# Patient Record
Sex: Male | Born: 1990 | Race: Black or African American | Hispanic: No | Marital: Single | Smoking: Current every day smoker
Health system: Southern US, Community
[De-identification: ages and names within clinical notes are randomized; demographics above are authoritative.]

---

## 2016-12-12 ENCOUNTER — Encounter (HOSPITAL_COMMUNITY): Payer: Self-pay | Admitting: Emergency Medicine

## 2016-12-12 ENCOUNTER — Emergency Department (HOSPITAL_COMMUNITY)
Admission: EM | Admit: 2016-12-12 | Discharge: 2016-12-12 | Disposition: A | Discharge status: Home / Self Care | Payer: Self-pay | Attending: Emergency Medicine | Admitting: Emergency Medicine

## 2016-12-12 DIAGNOSIS — H7292 Unspecified perforation of tympanic membrane, left ear: Secondary | ICD-10-CM | POA: Insufficient documentation

## 2016-12-12 DIAGNOSIS — F17290 Nicotine dependence, other tobacco product, uncomplicated: Secondary | ICD-10-CM | POA: Insufficient documentation

## 2016-12-12 MED ORDER — AMOXICILLIN-POT CLAVULANATE 875-125 MG PO TABS: 1.0000 | ORAL_TABLET | Freq: Two times a day (BID) | ORAL | 0 refills | Status: AC

## 2016-12-12 MED ORDER — IBUPROFEN 800 MG PO TABS: 800.0000 mg | ORAL_TABLET | Freq: Three times a day (TID) | ORAL | 0 refills | Status: AC | PRN

## 2016-12-12 MED ORDER — HYDROCODONE-ACETAMINOPHEN 5-325 MG PO TABS: 1.0000 | ORAL_TABLET | Freq: Four times a day (QID) | ORAL | 0 refills | Status: AC | PRN

## 2016-12-12 NOTE — ED Provider Notes (Signed)
  [facility]-EMERGENCY DEPT Provider Note   CSN: [id number] Arrival date & time: 12/12/16  2009     History   Chief Complaint Chief Complaint  Patient presents with  . Otalgia    HPI Devin Owens is a 26 y.o. male.  HPI Patient states about about 2 hours ago his daughter pushed a thermometer into his left ear canal.  He states he has decreased hearing and pain to that ear.  Patient states he did not take any medications prior to arrival.  Patient states nothing seems to make the condition better or worse.  Patient denies headache, blurred vision, weakness, numbness, dizziness or syncope History reviewed. No pertinent past medical history.  There are no active problems to display for this patient.   History reviewed. No pertinent surgical history.     Home Medications    Prior to Admission medications   Not on File    Family History No family history on file.  Social History Social History  Substance Use Topics  . Smoking status: Current Every Day Smoker    Types: Cigars  . Smokeless tobacco: Never Used  . Alcohol use Yes     Comment: occasionally     Allergies   Patient has no allergy information on record.   Review of Systems Review of Systems All other systems negative except as documented in the HPI. All pertinent positives and negatives as reviewed in the HPI.  Physical Exam Updated Vital Signs BP (!) 143/84 (BP Location: Right Arm)   Pulse 75   Temp 98.7 F (37.1 C) (Oral)   Resp 18   Ht 6' (1.829 m)   Wt 81.6 kg (180 lb)   SpO2 100%   BMI 24.41 kg/m   Physical Exam  Constitutional: He is oriented to person, place, and time. He appears well-developed and well-nourished. No distress.  HENT:  Head: Normocephalic and atraumatic.  Left Ear: Tympanic membrane is perforated.  Eyes: Pupils are equal, round, and reactive to light.  Pulmonary/Chest: Effort normal.  Neurological: He is alert and oriented to person, place, and time.  Skin: Skin is  warm and dry.  Psychiatric: He has a normal mood and affect.  Nursing note and vitals reviewed.    ED Treatments / Results  Labs (all labs ordered are listed, but only abnormal results are displayed) Labs Reviewed - No data to display  EKG  EKG Interpretation None       Radiology No results found.  Procedures Procedures (including critical care time)  Medications Ordered in ED Medications - No data to display   Initial Impression / Assessment and Plan / ED Course  I have reviewed the triage vital signs and the nursing notes.  Pertinent labs & imaging results that were available during my care of the patient were reviewed by me and considered in my medical decision making (see chart for details).     Patient will be referred to ENT.  Told to use cotton to catch any drainage from the ear.  Patient has a perforated TM.  Patient voices an understanding and all questions were answered  Final Clinical Impressions(s) / ED Diagnoses   Final diagnoses:  None    New Prescriptions New Prescriptions   No medications on file     Kyra Manges 12/12/16 2052    Nira Conn, MD 12/13/16 [phone]

## 2016-12-12 NOTE — Discharge Instructions (Signed)
Return here as needed.  Follow-up with the ENT provided.  He can use cotton to help with control of drainage and they help with the discomfort

## 2016-12-12 NOTE — ED Triage Notes (Signed)
Reports having a thermometer rammed into left ear by daughter earlier today.  Blood noted in ear canal.  Reports throbbing pain and decreased hearing.

## 2017-06-14 ENCOUNTER — Emergency Department (HOSPITAL_COMMUNITY): Payer: Worker's Compensation

## 2017-06-14 ENCOUNTER — Encounter (HOSPITAL_COMMUNITY): Payer: Self-pay | Admitting: Emergency Medicine

## 2017-06-14 ENCOUNTER — Emergency Department (HOSPITAL_COMMUNITY)
Admission: EM | Admit: 2017-06-14 | Discharge: 2017-06-14 | Disposition: A | Discharge status: Home / Self Care | Payer: Worker's Compensation | Attending: Emergency Medicine | Admitting: Emergency Medicine

## 2017-06-14 DIAGNOSIS — F17290 Nicotine dependence, other tobacco product, uncomplicated: Secondary | ICD-10-CM | POA: Insufficient documentation

## 2017-06-14 DIAGNOSIS — S0990XA Unspecified injury of head, initial encounter: Secondary | ICD-10-CM

## 2017-06-14 DIAGNOSIS — S0101XA Laceration without foreign body of scalp, initial encounter: Secondary | ICD-10-CM

## 2017-06-14 DIAGNOSIS — Y9389 Activity, other specified: Secondary | ICD-10-CM | POA: Insufficient documentation

## 2017-06-14 DIAGNOSIS — W228XXA Striking against or struck by other objects, initial encounter: Secondary | ICD-10-CM | POA: Insufficient documentation

## 2017-06-14 DIAGNOSIS — Z23 Encounter for immunization: Secondary | ICD-10-CM | POA: Insufficient documentation

## 2017-06-14 DIAGNOSIS — Y929 Unspecified place or not applicable: Secondary | ICD-10-CM | POA: Insufficient documentation

## 2017-06-14 DIAGNOSIS — Y990 Civilian activity done for income or pay: Secondary | ICD-10-CM | POA: Insufficient documentation

## 2017-06-14 DIAGNOSIS — Z79899 Other long term (current) drug therapy: Secondary | ICD-10-CM | POA: Insufficient documentation

## 2017-06-14 MED ORDER — ACETAMINOPHEN 325 MG PO TABS
650.0000 mg | ORAL_TABLET | Freq: Once | ORAL | Status: AC
  Administered 2017-06-14: 650 mg via ORAL
  Filled 2017-06-14: qty 2

## 2017-06-14 MED ORDER — NAPROXEN 500 MG PO TABS: 500.0000 mg | ORAL_TABLET | Freq: Two times a day (BID) | ORAL | 0 refills | Status: AC

## 2017-06-14 MED ORDER — NAPROXEN 250 MG PO TABS
500.0000 mg | ORAL_TABLET | Freq: Once | ORAL | Status: AC
  Administered 2017-06-14: 500 mg via ORAL
  Filled 2017-06-14: qty 2

## 2017-06-14 MED ORDER — TETANUS-DIPHTH-ACELL PERTUSSIS 5-2.5-18.5 LF-MCG/0.5 IM SUSP
0.5000 mL | Freq: Once | INTRAMUSCULAR | Status: AC
  Administered 2017-06-14: 0.5 mL via INTRAMUSCULAR
  Filled 2017-06-14: qty 0.5

## 2017-06-14 NOTE — ED Triage Notes (Signed)
Pt states he bumped his head at work at 1800 today. Denies LOC. 0.5 in laceration noted to head. A&O x 4.

## 2017-06-14 NOTE — Discharge Instructions (Signed)
Your were seen in the emergency department today following a head injury with a laceration to your scalp.  One staple was placed in the laceration to close it.  Your tetanus was updated.  Your head CT did not show any abnormalities.  We have given you a prescription for naproxen for pain. Naproxen is a nonsteroidal anti-inflammatory medication that will help with pain and swelling. Be sure to take this medication as prescribed with food, 1 pill every 12 hours,  It should be taken with food, as it can cause stomach upset, and more seriously, stomach bleeding. Do not take other nonsteroidal anti-inflammatory medications with this such as Advil, Motrin, or Aleve.   We have prescribed you new medication(s) today. Discuss the medications prescribed today with your pharmacist as they can have adverse effects and interactions with your other medicines including over the counter and prescribed medications. Seek medical evaluation if you start to experience new or abnormal symptoms after taking one of these medicines, seek care immediately if you start to experience difficulty breathing, feeling of your throat closing, facial swelling, or rash as these could be indications of a more serious allergic reaction    You will need to have the staple removed in 7 days.  You may have this removed in the emergency department, an urgent care, or by her primary care doctor.  Return to the emergency department sooner for any new or worsening symptoms including but not limited to fever, redness around the cut, discharge from the cut, change in your vision, numbness, weakness, vomiting, seizure activity, or passing out.  You may also return for any concerns that you may have.

## 2017-06-14 NOTE — ED Notes (Signed)
Patient transported to CT 

## 2017-06-14 NOTE — ED Provider Notes (Signed)
[facility] EMERGENCY DEPARTMENT Provider Note   CSN: [id number] Arrival date & time: 06/14/17  2045     History   Chief Complaint Chief Complaint  Patient presents with  . Laceration    HPI Devin Owens is a 27 y.o. male with a history of tobacco abuse who presents to the emergency department status post head injury at 1800 today with complaint of laceration and headache.  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  States that he did not have any loss of consciousness.  States that he felt mildly lightheaded following the incident, this has resolved.  States that he had a gradual onset steady progression headache most prominently in the area of the laceration.  States that headache has been constant, rates it an 8 out of 10 in severity.  States that he took Excedrin without significant relief.  Denies numbness, weakness, change in vision, nausea, vomiting, neck pain, syncope, or seizure activity.  Patient is unsure when his last tetanus shot was.  HPI  History reviewed. No pertinent past medical history.  There are no active problems to display for this patient.   History reviewed. No pertinent surgical history.      Home Medications    Prior to Admission medications   Medication Sig Start Date End Date Taking? Authorizing Provider  amoxicillin-clavulanate (AUGMENTIN) 875-125 MG tablet Take 1 tablet by mouth 2 (two) times daily. 12/12/16   Lawyer, Cristal Deer, PA-C  HYDROcodone-acetaminophen (NORCO/VICODIN) 5-325 MG tablet Take 1 tablet by mouth every 6 (six) hours as needed for moderate pain. 12/12/16   Lawyer, Cristal Deer, PA-C  ibuprofen (ADVIL,MOTRIN) 800 MG tablet Take 1 tablet (800 mg total) by mouth every 8 (eight) hours as needed. 12/12/16   Charlestine Night, PA-C    Family History History reviewed. No pertinent family history.  Social History Social History    Tobacco Use  . Smoking status: Current Every Day Smoker    Types: Cigars  . Smokeless tobacco: Never Used  Substance Use Topics  . Alcohol use: Yes    Comment: occasionally  . Drug use: No     Allergies   Patient has no known allergies.   Review of Systems Review of Systems  Constitutional: Negative for chills and fever.  Eyes: Negative for visual disturbance.  Gastrointestinal: Negative for nausea and vomiting.  Musculoskeletal: Negative for neck pain.  Skin: Positive for wound.  Neurological: Positive for light-headedness (resolved at present) and headaches. Negative for dizziness, seizures, syncope, speech difficulty, weakness and numbness.    Physical Exam Updated Vital Signs BP 132/79 (BP Location: Right Arm)   Pulse 72   Temp 98.1 F (36.7 C) (Oral)   Resp 16   SpO2 100%   Physical Exam  Constitutional: He appears well-developed and well-nourished.  Non-toxic appearance. No distress.  HENT:  Head: Normocephalic and atraumatic. Head is without raccoon's eyes and without Battle's sign.  Right Ear: No hemotympanum.  Left Ear: No hemotympanum.  Nose: Nose normal.  Mouth/Throat: Uvula is midline and oropharynx is clear and moist.  Eyes: Pupils are equal, round, and reactive to light. Conjunctivae and EOM are normal. Right eye exhibits no discharge. Left eye exhibits no discharge.  Neck: Normal range of motion. Neck supple. No spinous process tenderness and no muscular tenderness present.  Cardiovascular: Normal rate and regular rhythm.  Pulmonary/Chest: Effort normal and breath sounds normal.  Neurological:  He is alert.  Clear speech.  CN III through XII grossly intact.  Sensation grossly intact bilateral upper and lower extremities.  5 out of 5 grip strength bilaterally.  5 out of 5 strength with plantar and dorsiflexion.  Gait is steady.  Skin: Skin is warm and dry.     Psychiatric: He has a normal mood and affect. His behavior is normal. Thought content  normal.  Nursing note and vitals reviewed.    ED Treatments / Results  Labs (all labs ordered are listed, but only abnormal results are displayed) Labs Reviewed - No data to display  EKG None  Radiology Ct Head Wo Contrast  Result Date: 06/14/2017 CLINICAL DATA:  Laceration, headache EXAM: CT HEAD WITHOUT CONTRAST TECHNIQUE: Contiguous axial images were obtained from the base of the skull through the vertex without intravenous contrast. COMPARISON:  None. FINDINGS: Brain: No evidence of acute infarction, hemorrhage, hydrocephalus, extra-axial collection or mass lesion/mass effect. Vascular: No hyperdense vessel or unexpected calcification. Skull: Normal. Negative for fracture or focal lesion. Sinuses/Orbits: No acute finding. Other: None IMPRESSION: Negative non contrasted CT appearance of the brain. Electronically Signed   By: Jasmine PangKim  Fujinaga M.D.   On: 06/14/2017 22:40    Procedures .Marland Kitchen.Laceration Repair Date/Time: 06/14/2017 11:11 PM Performed by: Cherly AndersonPetrucelli, Smaran Gaus R, PA-C Authorized by: Cherly AndersonPetrucelli, Quadir Muns R, PA-C   Consent:    Consent obtained:  Verbal   Consent given by:  Patient   Risks discussed:  Infection, pain, retained foreign body and poor cosmetic result   Alternatives discussed:  No treatment Anesthesia (see MAR for exact dosages):    Anesthesia method:  None Laceration details:    Location:  Scalp   Scalp location:  Frontal   Length (cm):  1 Repair type:    Repair type:  Simple Pre-procedure details:    Preparation:  Patient was prepped and draped in usual sterile fashion Exploration:    Hemostasis achieved with:  Cautery   Wound exploration: wound explored through full range of motion and entire depth of wound probed and visualized     Contaminated: no   Treatment:    Area cleansed with:  Betadine   Amount of cleaning:  Standard   Irrigation solution:  Sterile saline   Irrigation method:  Pressure wash Skin repair:    Repair method:  Staples   Number  of staples:  1 Approximation:    Approximation:  Close Post-procedure details:    Dressing:  Antibiotic ointment   Patient tolerance of procedure:  Tolerated well, no immediate complications   (including critical care time)  Medications Ordered in ED Medications  naproxen (NAPROSYN) tablet 500 mg (has no administration in time range)  acetaminophen (TYLENOL) tablet 650 mg (has no administration in time range)  Tdap (BOOSTRIX) injection 0.5 mL (has no administration in time range)     Initial Impression / Assessment and Plan / ED Course  I have reviewed the triage vital signs and the nursing notes.  Pertinent labs & imaging results that were available during my care of the patient were reviewed by me and considered in my medical decision making (see chart for details).   Patient presents s/p head injury with laceration and headache. He is nontoxic appearing, in no apparent distress. Head CT ordered and negative for acute abnormality- doubt bleed. Patient without focal neurologic deficits on exam. Cspine is nontender. Laceration repair per procedure note above, 1 staple placed, tetanus updated. Will treat pain with Tylenol and Naproxen in the ED. Will  DC home with Naproxen, patient will require follow up in 1 week for staple removal and wound recheck. I discussed results, treatment plan, need for staple removal follow-up, and return precautions with the patient. Provided opportunity for questions, patient confirmed understanding and is in agreement with plan.  .   Final Clinical Impressions(s) / ED Diagnoses   Final diagnoses:  Injury of head, initial encounter  Laceration of scalp, initial encounter    ED Discharge Orders        Ordered    naproxen (NAPROSYN) 500 MG tablet  2 times daily     06/14/17 2317       Cherly Anderson, PA-C 06/14/17 2336    Terrilee Files, MD 06/15/17 1217

## 2020-02-15 ENCOUNTER — Emergency Department (HOSPITAL_COMMUNITY): Payer: HRSA Program

## 2020-02-15 ENCOUNTER — Emergency Department (HOSPITAL_COMMUNITY)
Admission: EM | Admit: 2020-02-15 | Discharge: 2020-02-15 | Disposition: A | Discharge status: Home / Self Care | Payer: HRSA Program | Attending: Emergency Medicine | Admitting: Emergency Medicine

## 2020-02-15 ENCOUNTER — Encounter (HOSPITAL_COMMUNITY): Payer: Self-pay | Admitting: Emergency Medicine

## 2020-02-15 ENCOUNTER — Other Ambulatory Visit: Payer: Self-pay

## 2020-02-15 DIAGNOSIS — U071 COVID-19: Secondary | ICD-10-CM | POA: Diagnosis not present

## 2020-02-15 DIAGNOSIS — F17290 Nicotine dependence, other tobacco product, uncomplicated: Secondary | ICD-10-CM | POA: Diagnosis not present

## 2020-02-15 DIAGNOSIS — R079 Chest pain, unspecified: Secondary | ICD-10-CM | POA: Diagnosis present

## 2020-02-15 LAB — CBC WITH DIFFERENTIAL/PLATELET
Abs Immature Granulocytes: 0.01 10*3/uL (ref 0.00–0.07)
Basophils Absolute: 0 10*3/uL (ref 0.0–0.1)
Basophils Relative: 0 %
Eosinophils Absolute: 0.2 10*3/uL (ref 0.0–0.5)
Eosinophils Relative: 5 %
HCT: 45.4 % (ref 39.0–52.0)
Hemoglobin: 14.9 g/dL (ref 13.0–17.0)
Immature Granulocytes: 0 %
Lymphocytes Relative: 39 %
Lymphs Abs: 1.2 10*3/uL (ref 0.7–4.0)
MCH: 30.4 pg (ref 26.0–34.0)
MCHC: 32.8 g/dL (ref 30.0–36.0)
MCV: 92.7 fL (ref 80.0–100.0)
Monocytes Absolute: 0.5 10*3/uL (ref 0.1–1.0)
Monocytes Relative: 17 %
Neutro Abs: 1.2 10*3/uL — ABNORMAL LOW (ref 1.7–7.7)
Neutrophils Relative %: 39 %
Platelets: 186 10*3/uL (ref 150–400)
RBC: 4.9 MIL/uL (ref 4.22–5.81)
RDW: 12.5 % (ref 11.5–15.5)
WBC: 3 10*3/uL — ABNORMAL LOW (ref 4.0–10.5)
nRBC: 0 % (ref 0.0–0.2)

## 2020-02-15 LAB — TROPONIN I (HIGH SENSITIVITY)
Troponin I (High Sensitivity): 3 ng/L (ref <18)
Troponin I (High Sensitivity): 3 ng/L (ref <18)

## 2020-02-15 LAB — COMPREHENSIVE METABOLIC PANEL
ALT: 25 U/L (ref 0–44)
AST: 36 U/L (ref 15–41)
Albumin: 3.9 g/dL (ref 3.5–5.0)
Alkaline Phosphatase: 51 U/L (ref 38–126)
Anion gap: 11 (ref 5–15)
BUN: 12 mg/dL (ref 6–20)
CO2: 24 mmol/L (ref 22–32)
Calcium: 8.8 mg/dL — ABNORMAL LOW (ref 8.9–10.3)
Chloride: 100 mmol/L (ref 98–111)
Creatinine, Ser: 1.35 mg/dL — ABNORMAL HIGH (ref 0.61–1.24)
GFR, Estimated: >60 mL/min (ref >60)
Glucose, Bld: 98 mg/dL (ref 70–99)
Potassium: 4 mmol/L (ref 3.5–5.1)
Sodium: 135 mmol/L (ref 135–145)
Total Bilirubin: 0.5 mg/dL (ref 0.3–1.2)
Total Protein: 6.8 g/dL (ref 6.5–8.1)

## 2020-02-15 LAB — RESP PANEL BY RT-PCR (FLU A&B, COVID) ARPGX2
Influenza A by PCR: NEGATIVE
Influenza B by PCR: NEGATIVE
SARS Coronavirus 2 by RT PCR: POSITIVE — AB

## 2020-02-15 MED ORDER — ACETAMINOPHEN 325 MG PO TABS
650.0000 mg | ORAL_TABLET | Freq: Once | ORAL | Status: AC
  Administered 2020-02-15: 650 mg via ORAL
  Filled 2020-02-15: qty 2

## 2020-02-15 MED ORDER — BENZONATATE 100 MG PO CAPS: 100.0000 mg | ORAL_CAPSULE | Freq: Three times a day (TID) | ORAL | 0 refills | Status: AC

## 2020-02-15 NOTE — Discharge Instructions (Signed)
Use cough medication as directed.  You should be isolated for at least 7 days since the onset of your symptoms AND >72 hours after symptoms resolution (absence of fever without the use of fever reducing medication and improvement in respiratory symptoms), whichever is longer  Please follow up with your primary care provider within 5-7 days for re-evaluation of your symptoms. If you do not have a primary care provider, information for a healthcare clinic has been provided for you to make arrangements for follow up care. Please return to the emergency department for any new or worsening symptoms including shortness of breath or chest pain.

## 2020-02-15 NOTE — ED Provider Notes (Signed)
[facility] Provider Note   CSN: [id number] Arrival date & time: 02/15/20  1750     History Chief Complaint  Patient presents with  . Chest Pain  . Headache    Devin Owens is a 29 y.o. male.  HPI   Pt is a 29 y/o male who presents to the ED today for eval of covid sxs. C/o chest pain, cough, minimal sob, headache and body aches. Denies nvd. Decreased appetite. Tried theraflu and Excedrin with temporary relief.   History reviewed. No pertinent past medical history.  There are no problems to display for this patient.   History reviewed. No pertinent surgical history.     No family history on file.  Social History   Tobacco Use  . Smoking status: Current Every Day Smoker    Types: Cigars  . Smokeless tobacco: Never Used  Substance Use Topics  . Alcohol use: Yes    Comment: occasionally  . Drug use: No    Home Medications Prior to Admission medications   Medication Sig Start Date End Date Taking? Authorizing Provider  amoxicillin-clavulanate (AUGMENTIN) 875-125 MG tablet Take 1 tablet by mouth 2 (two) times daily. 12/12/16   Lawyer, Cristal Deer, PA-C  benzonatate (TESSALON) 100 MG capsule Take 1 capsule (100 mg total) by mouth every 8 (eight) hours for 5 days. 02/15/20 02/20/20  Toshie Demelo S, PA-C  HYDROcodone-acetaminophen (NORCO/VICODIN) 5-325 MG tablet Take 1 tablet by mouth every 6 (six) hours as needed for moderate pain. 12/12/16   Lawyer, Cristal Deer, PA-C  ibuprofen (ADVIL,MOTRIN) 800 MG tablet Take 1 tablet (800 mg total) by mouth every 8 (eight) hours as needed. 12/12/16   Lawyer, Cristal Deer, PA-C  naproxen (NAPROSYN) 500 MG tablet Take 1 tablet (500 mg total) by mouth 2 (two) times daily. 06/14/17   Petrucelli, Pleas Koch, PA-C    Allergies    Patient has no known allergies.  Review of Systems   Review of Systems  Constitutional: Positive for chills, diaphoresis and fatigue. Negative for fever.  HENT: Negative  for congestion, ear pain, rhinorrhea and sore throat.   Eyes: Negative for pain and visual disturbance.  Respiratory: Positive for cough and shortness of breath.   Cardiovascular: Positive for chest pain. Negative for palpitations.  Gastrointestinal: Negative for abdominal pain, constipation, diarrhea, nausea and vomiting.  Genitourinary: Negative for dysuria and hematuria.  Musculoskeletal: Positive for myalgias. Negative for back pain.  Skin: Negative for rash.  Neurological: Positive for headaches. Negative for seizures and syncope.  All other systems reviewed and are negative.   Physical Exam Updated Vital Signs BP 117/80   Pulse 67   Temp 100.3 F (37.9 C) (Oral)   Resp 17   Ht 6' (1.829 m)   Wt 83.9 kg   SpO2 99%   BMI 25.09 kg/m   Physical Exam Vitals and nursing note reviewed.  Constitutional:      Appearance: He is well-developed.  HENT:     Head: Normocephalic and atraumatic.  Eyes:     Conjunctiva/sclera: Conjunctivae normal.  Cardiovascular:     Rate and Rhythm: Normal rate and regular rhythm.     Heart sounds: Normal heart sounds. No murmur heard.   Pulmonary:     Effort: Pulmonary effort is normal. No respiratory distress.     Breath sounds: Normal breath sounds. No wheezing, rhonchi or rales.  Abdominal:     General: Bowel sounds are normal.     Palpations: Abdomen is soft.  Tenderness: There is no abdominal tenderness. There is no guarding or rebound.  Musculoskeletal:     Cervical back: Neck supple.  Skin:    General: Skin is warm and dry.  Neurological:     Mental Status: He is alert.     Comments: Clear speech, moving all extremities.     ED Results / Procedures / Treatments   Labs (all labs ordered are listed, but only abnormal results are displayed) Labs Reviewed  RESP PANEL BY RT-PCR (FLU A&B, COVID) ARPGX2 - Abnormal; Notable for the following components:      Result Value   SARS Coronavirus 2 by RT PCR POSITIVE (*)    All other  components within normal limits  CBC WITH DIFFERENTIAL/PLATELET - Abnormal; Notable for the following components:   WBC 3.0 (*)    Neutro Abs 1.2 (*)    All other components within normal limits  COMPREHENSIVE METABOLIC PANEL - Abnormal; Notable for the following components:   Creatinine, Ser 1.35 (*)    Calcium 8.8 (*)    All other components within normal limits  TROPONIN I (HIGH SENSITIVITY)  TROPONIN I (HIGH SENSITIVITY)    EKG None  Radiology DG Chest Portable 1 View  Result Date: 02/15/2020 CLINICAL DATA:  Chest pain and headache for 2 days. EXAM: PORTABLE CHEST 1 VIEW COMPARISON:  None. FINDINGS: Lungs clear. Heart size normal. No pneumothorax or pleural fluid. No bony abnormality. IMPRESSION: Normal chest. Electronically Signed   By: Drusilla Kanner M.D.   On: 02/15/2020 18:51    Procedures Procedures (including critical care time)  Medications Ordered in ED Medications  acetaminophen (TYLENOL) tablet 650 mg (650 mg Oral Given 02/15/20 2144)    ED Course  I have reviewed the triage vital signs and the nursing notes.  Pertinent labs & imaging results that were available during my care of the patient were reviewed by me and considered in my medical decision making (see chart for details).    MDM Rules/Calculators/A&P                          Patient presenting for evaluation for Covid.  Reports symptoms ongoing for 2  days.  Patient nontoxic, well-appearing, no distress.  Vital signs are reassuring.  Chest x-ray reviewed/interpreted and negative.  Tested for Covid in the ED. Results positive.  Labs with mild leukopenia consistent with covid. Mild elevation in cr likely due to dehydration. Advised to push fluids. Electrolytes and lfts nonconcerning. trops neg, doubt acs, myocarditis/pericarditis or other emergent cause of chest pain. ekg nonischemic.  Advised on quarantine measures. Will give Rx for symptomatic management. Advised on f/u and return precautions. Pt voiced  understanding of the plan and reasons to return. All questions answered, pt stable for d/c.  [facility] was evaluated in Emergency Department on 02/15/2020 for the symptoms described in the history of present illness. He was evaluated in the context of the global COVID-19 pandemic, which necessitated consideration that the patient might be at risk for infection with the SARS-CoV-2 virus that causes COVID-19. Institutional protocols and algorithms that pertain to the evaluation of patients at risk for COVID-19 are in a state of rapid change based on information released by regulatory bodies including the CDC and federal and state organizations. These policies and algorithms were followed during the patient's care in the ED.   Final Clinical Impression(s) / ED Diagnoses Final diagnoses:  COVID    Rx / DC Orders ED  Discharge Orders         Ordered    benzonatate (TESSALON) 100 MG capsule  Every 8 hours        02/15/20 2249           Abishai Viegas S, PA-C 02/15/20 2256    Virgina Norfolk, DO 02/16/20 0030

## 2020-02-15 NOTE — ED Triage Notes (Signed)
Pt to ED with c/o chest pain and headache  Onset 2 days ago.  Pt c/o slight cough,.  Sts no appetite

## 2020-02-16 ENCOUNTER — Other Ambulatory Visit: Payer: Self-pay | Admitting: Nurse Practitioner

## 2020-02-16 DIAGNOSIS — U071 COVID-19: Secondary | ICD-10-CM

## 2020-02-16 NOTE — Progress Notes (Signed)
I connected by phone with [facility] on 02/16/2020 at 12:38 PM to discuss the potential use of a treatment for mild to moderate COVID-19 viral infection in non-hospitalized patients.  This patient is a 29 y.o. male that meets the FDA criteria for Emergency Use Authorization of bamlanivimab/etesevimab, casirivimab\imdevimab, or sotrovimab  Has a (+) direct SARS-CoV-2 viral test result  Has mild or moderate COVID-19   Is ? 29 years of age and weighs ? 40 kg  Is NOT hospitalized due to COVID-19  Is NOT requiring oxygen therapy or requiring an increase in baseline oxygen flow rate due to COVID-19  Is within 10 days of symptom onset  Has at least one of the high risk factor(s) for progression to severe COVID-19 and/or hospitalization as defined in EUA.  Specific high risk criteria : BMI > 25 and Other high risk medical condition per CDC:  smoker & svi   I have spoken and communicated the following to the patient or parent/caregiver:  1. FDA has authorized the emergency use of bamlanivimab/etesevimab, casirivimab\imdevimab, or sotrovimab for the treatment of mild to moderate COVID-19 in adults and pediatric patients with positive results of direct SARS-CoV-2 viral testing who are 71 years of age and older weighing at least 40 kg, and who are at high risk for progressing to severe COVID-19 and/or hospitalization.  2. The significant known and potential risks and benefits of bamlanivimab/etesevimab, casirivimab\imdevimab, or sotrovimab, and the extent to which such potential risks and benefits are unknown.  3. Information on available alternative treatments and the risks and benefits of those alternatives, including clinical trials.  4. Patients treated with bamlanivimab/etesevimab, casirivimab\imdevimab, or sotrovimab should continue to self-isolate and use infection control measures (e.g., wear mask, isolate, social distance, avoid sharing personal items, clean and disinfect "high touch"  surfaces, and frequent handwashing) according to CDC guidelines.   5. The patient or parent/caregiver has the option to accept or refuse bamlanivimab/etesevimab, casirivimab\imdevimab, or sotrovimab.  After reviewing this information with the patient, the patient has agreed to receive one of the available covid 19 monoclonal antibodies and will be provided an appropriate fact sheet prior to infusion.Consuello Masse, DNP, AGNP-C [phone] (Infusion Center Hotline)

## 2020-02-20 ENCOUNTER — Telehealth: Payer: Self-pay | Admitting: General Practice

## 2020-02-20 NOTE — Telephone Encounter (Signed)
Unable to reach pt by phone to schedule follow up appt at [facility]; per recording voicemail box full. No PCP on chart.

## 2022-02-03 IMAGING — DX DG CHEST 1V PORT
1 series · 1 of 1 positions shown · non-contrast
Comparison: None.

CLINICAL DATA: Chest pain and headache for 2 days.

EXAM:
PORTABLE CHEST 1 VIEW

[chest ap]
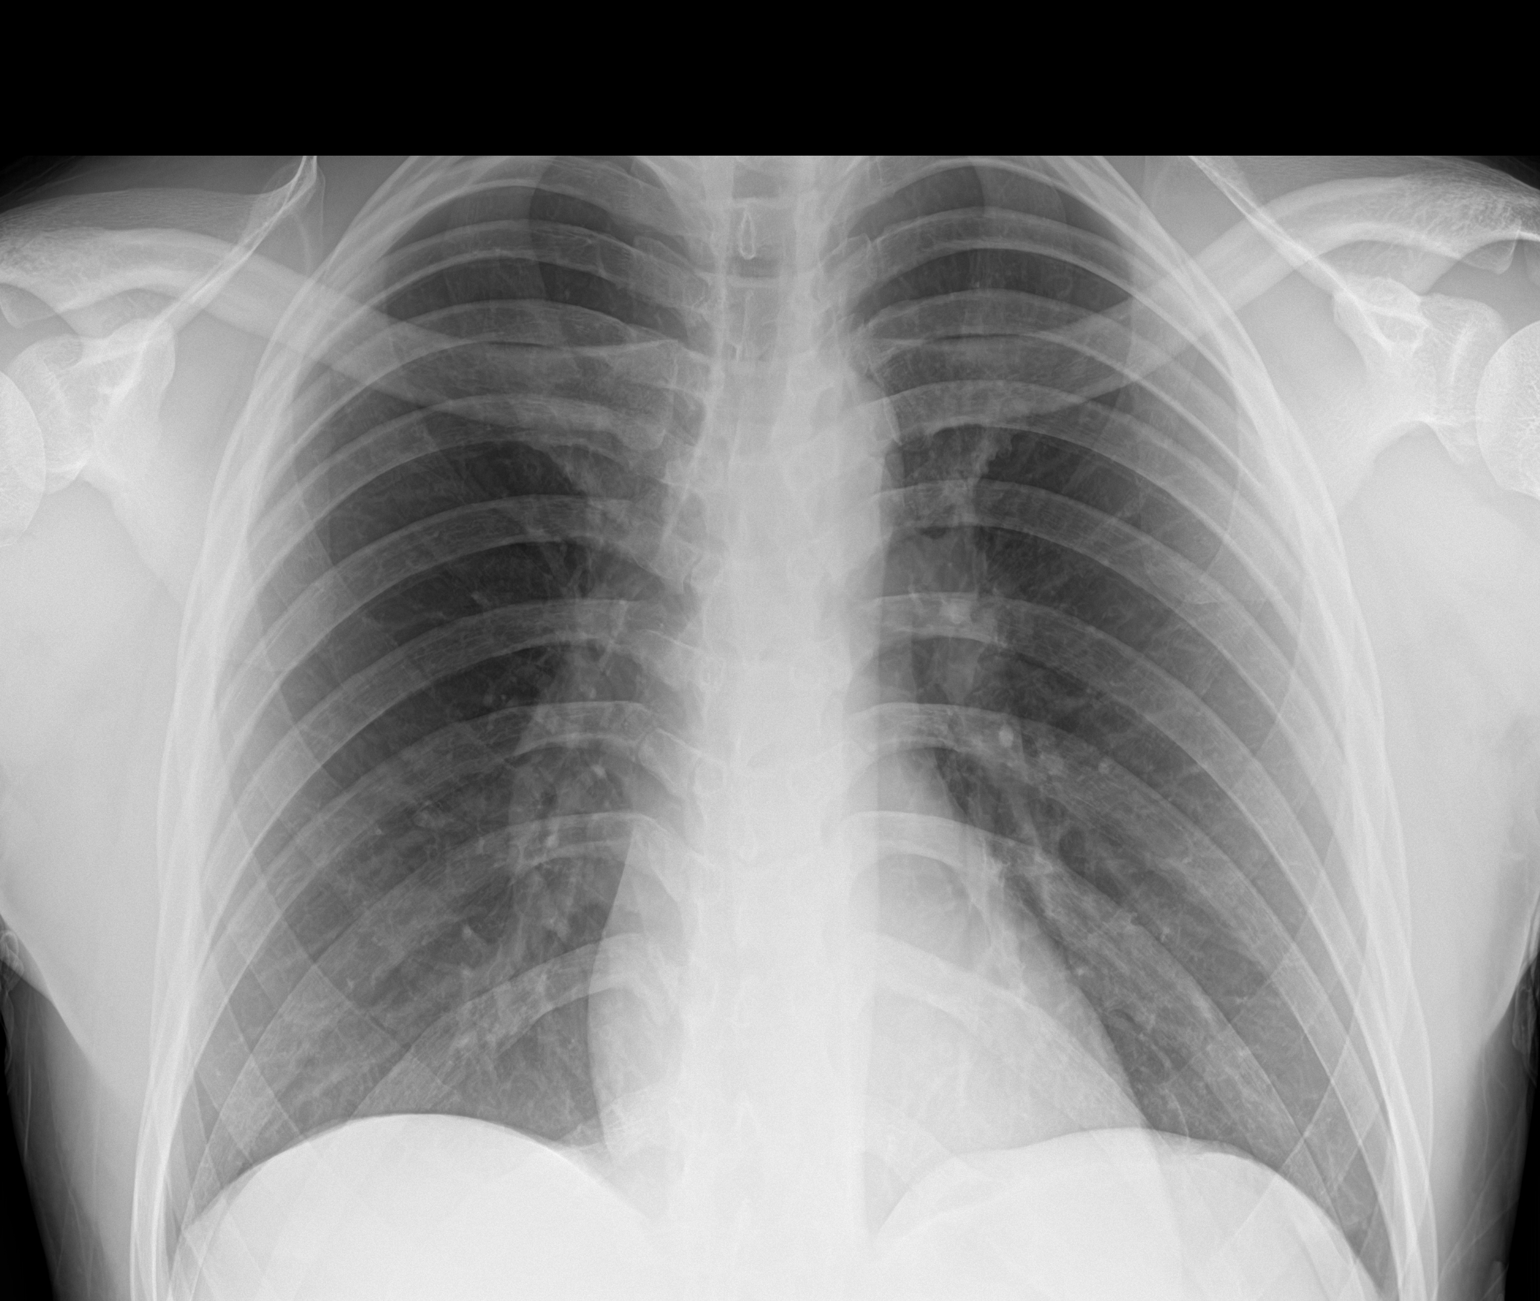

[1 of 1 positions shown; findings below may reference images not displayed]

FINDINGS: Lungs clear. Heart size normal. No pneumothorax or pleural fluid. No
bony abnormality.
IMPRESSION: Normal chest.
